# Patient Record
Sex: Female | Born: 1950
Health system: Southern US, Community
[De-identification: ages and names within clinical notes are randomized; demographics above are authoritative.]

---

## 1997-09-08 ENCOUNTER — Encounter: Admission: RE | Admit: 1997-09-08 | Discharge: 1997-12-07 | Payer: Self-pay | Admitting: Pediatrics

## 1999-11-28 ENCOUNTER — Encounter: Payer: Self-pay | Admitting: Family Medicine

## 1999-11-28 ENCOUNTER — Encounter: Admission: RE | Admit: 1999-11-28 | Discharge: 1999-11-28 | Payer: Self-pay | Admitting: Family Medicine

## 2000-11-28 ENCOUNTER — Encounter: Payer: Self-pay | Admitting: Family Medicine

## 2000-11-28 ENCOUNTER — Encounter: Admission: RE | Admit: 2000-11-28 | Discharge: 2000-11-28 | Payer: Self-pay | Admitting: Family Medicine

## 2000-12-05 ENCOUNTER — Other Ambulatory Visit: Admission: RE | Admit: 2000-12-05 | Discharge: 2000-12-05 | Payer: Self-pay | Admitting: Obstetrics and Gynecology

## 2001-12-03 ENCOUNTER — Encounter: Admission: RE | Admit: 2001-12-03 | Discharge: 2001-12-03 | Payer: Self-pay | Admitting: Obstetrics and Gynecology

## 2001-12-03 ENCOUNTER — Encounter: Payer: Self-pay | Admitting: Obstetrics and Gynecology

## 2001-12-24 ENCOUNTER — Ambulatory Visit (HOSPITAL_COMMUNITY): Admission: RE | Admit: 2001-12-24 | Discharge: 2001-12-24 | Payer: Self-pay | Admitting: Rheumatology

## 2001-12-24 ENCOUNTER — Encounter: Payer: Self-pay | Admitting: Rheumatology

## 2002-01-02 ENCOUNTER — Other Ambulatory Visit: Admission: RE | Admit: 2002-01-02 | Discharge: 2002-01-02 | Payer: Self-pay | Admitting: Obstetrics and Gynecology

## 2002-01-07 ENCOUNTER — Ambulatory Visit (HOSPITAL_COMMUNITY): Admission: RE | Admit: 2002-01-07 | Discharge: 2002-01-07 | Payer: Self-pay | Admitting: Interventional Radiology

## 2002-01-07 ENCOUNTER — Encounter: Payer: Self-pay | Admitting: Rheumatology

## 2002-02-14 ENCOUNTER — Encounter: Payer: Self-pay | Admitting: Emergency Medicine

## 2002-02-14 ENCOUNTER — Emergency Department (HOSPITAL_COMMUNITY): Admission: EM | Admit: 2002-02-14 | Discharge: 2002-02-14 | Payer: Self-pay | Admitting: Emergency Medicine

## 2002-03-24 ENCOUNTER — Ambulatory Visit (HOSPITAL_COMMUNITY): Admission: RE | Admit: 2002-03-24 | Discharge: 2002-03-24 | Payer: Self-pay | Admitting: Gastroenterology

## 2002-03-24 ENCOUNTER — Encounter (INDEPENDENT_AMBULATORY_CARE_PROVIDER_SITE_OTHER): Payer: Self-pay

## 2002-12-08 ENCOUNTER — Encounter: Payer: Self-pay | Admitting: Obstetrics and Gynecology

## 2002-12-08 ENCOUNTER — Encounter: Admission: RE | Admit: 2002-12-08 | Discharge: 2002-12-08 | Payer: Self-pay | Admitting: Obstetrics and Gynecology

## 2003-01-08 ENCOUNTER — Other Ambulatory Visit: Admission: RE | Admit: 2003-01-08 | Discharge: 2003-01-08 | Payer: Self-pay | Admitting: Obstetrics and Gynecology

## 2003-12-15 ENCOUNTER — Encounter: Admission: RE | Admit: 2003-12-15 | Discharge: 2003-12-15 | Payer: Self-pay | Admitting: Obstetrics and Gynecology

## 2004-01-20 ENCOUNTER — Other Ambulatory Visit: Admission: RE | Admit: 2004-01-20 | Discharge: 2004-01-20 | Payer: Self-pay | Admitting: Obstetrics and Gynecology

## 2004-12-15 ENCOUNTER — Encounter: Admission: RE | Admit: 2004-12-15 | Discharge: 2004-12-15 | Payer: Self-pay | Admitting: Obstetrics and Gynecology

## 2005-01-24 ENCOUNTER — Other Ambulatory Visit: Admission: RE | Admit: 2005-01-24 | Discharge: 2005-01-24 | Payer: Self-pay | Admitting: Obstetrics and Gynecology

## 2005-12-18 ENCOUNTER — Encounter: Admission: RE | Admit: 2005-12-18 | Discharge: 2005-12-18 | Payer: Self-pay | Admitting: Obstetrics and Gynecology

## 2007-06-20 ENCOUNTER — Other Ambulatory Visit: Admission: RE | Admit: 2007-06-20 | Discharge: 2007-06-20 | Payer: Self-pay | Admitting: Family Medicine

## 2007-07-02 ENCOUNTER — Encounter: Admission: RE | Admit: 2007-07-02 | Discharge: 2007-07-02 | Payer: Self-pay | Admitting: Family Medicine

## 2007-10-04 ENCOUNTER — Encounter: Admission: RE | Admit: 2007-10-04 | Discharge: 2007-10-04 | Payer: Self-pay | Admitting: Family Medicine

## 2008-11-18 ENCOUNTER — Other Ambulatory Visit: Admission: RE | Admit: 2008-11-18 | Discharge: 2008-11-18 | Payer: Self-pay | Admitting: Family Medicine

## 2008-12-01 ENCOUNTER — Encounter: Admission: RE | Admit: 2008-12-01 | Discharge: 2008-12-01 | Payer: Self-pay | Admitting: Family Medicine

## 2009-12-02 ENCOUNTER — Encounter: Admission: RE | Admit: 2009-12-02 | Discharge: 2009-12-02 | Payer: Self-pay | Admitting: Family Medicine

## 2010-06-05 ENCOUNTER — Encounter: Payer: Self-pay | Admitting: Family Medicine

## 2010-09-30 NOTE — Op Note (Signed)
   NAME:  Susan Manning, Susan Manning                                  ACCOUNT NO.:  [id number]   MEDICAL RECORD NO.:  [id number]                   PATIENT TYPE:  AMB   LOCATION:  ENDO                                 FACILITY:  [facility]   PHYSICIAN:  John C. Madilyn Fireman, M.D.                 DATE OF BIRTH:  [birth year]   DATE OF PROCEDURE:  03/24/2002  DATE OF DISCHARGE:                                 OPERATIVE REPORT   PROCEDURE:  Colonoscopy with polypectomy.   ENDOSCOPIST:  Everardo All.  Madilyn Fireman, M.D.   INDICATIONS:  The patient is a 61 year old patient with iron deficiency  anemia.  No prior colon screening.   DESCRIPTION OF PROCEDURE:  The patient was placed in the left lateral  decubitus position and placed on the pulse monitor with continuous low flow  oxygen delivered by nasal cannula.  She was sedated with 60 mg of IV Demerol  and 6 mg of IV Versed.  The Olympus video colonoscope was inserted into the  rectum and advanced to the cecum, confirmed by transillumination of  McBurney's point and visualization of the ileocecal valve and the  appendiceal orifice.  The prep was good.  The cecum, ascending, transverse,  descending and sigmoid colon all appeared normal with no masses, polyps,  diverticula or other mucosa abnormalities.  Within the rectum there was seen  a 1.2 cm polyp which was removed by snare.  This was approximately 18 cm  from the anal verge.  This was removed by snare.  The remainder of the  rectum appeared normal.  The scope was then withdrawn, and the patient  returned to the recovery room in stable condition.  She tolerated the  procedure well and there were no immediate complications.   IMPRESSIONS:  1. Rectal polyp 1.2 cm.  2. Otherwise normal colonoscopy.   PLAN:  Await biopsy results.                                               John C. Madilyn Fireman, M.D.    J[facility]/MEDQ  D:  03/24/2002  T:  03/24/2002  Job:  [id number]   cc:   Vikki Ports, M.D.  [address] [place]  [place]  [place] [address]  Fax: [phone]

## 2010-11-30 ENCOUNTER — Other Ambulatory Visit: Payer: Self-pay | Admitting: Family Medicine

## 2010-11-30 DIAGNOSIS — Z1231 Encounter for screening mammogram for malignant neoplasm of breast: Secondary | ICD-10-CM

## 2010-12-07 ENCOUNTER — Other Ambulatory Visit: Payer: Self-pay | Admitting: Obstetrics and Gynecology

## 2010-12-07 ENCOUNTER — Ambulatory Visit
Admission: RE | Admit: 2010-12-07 | Discharge: 2010-12-07 | Disposition: A | Discharge status: Home / Self Care | Payer: BC Managed Care – PPO | Source: Ambulatory Visit | Attending: Family Medicine | Admitting: Family Medicine

## 2010-12-07 DIAGNOSIS — Z1231 Encounter for screening mammogram for malignant neoplasm of breast: Secondary | ICD-10-CM

## 2011-12-13 ENCOUNTER — Other Ambulatory Visit: Payer: Self-pay | Admitting: Obstetrics and Gynecology

## 2011-12-13 DIAGNOSIS — N6310 Unspecified lump in the right breast, unspecified quadrant: Secondary | ICD-10-CM

## 2011-12-20 ENCOUNTER — Ambulatory Visit
Admission: RE | Admit: 2011-12-20 | Discharge: 2011-12-20 | Disposition: A | Discharge status: Home / Self Care | Payer: BC Managed Care – PPO | Source: Ambulatory Visit | Attending: Obstetrics and Gynecology | Admitting: Obstetrics and Gynecology

## 2011-12-20 DIAGNOSIS — N6310 Unspecified lump in the right breast, unspecified quadrant: Secondary | ICD-10-CM

## 2012-01-22 ENCOUNTER — Other Ambulatory Visit: Payer: Self-pay | Admitting: Gastroenterology

## 2012-11-19 ENCOUNTER — Other Ambulatory Visit: Payer: Self-pay

## 2012-11-19 DIAGNOSIS — Z1231 Encounter for screening mammogram for malignant neoplasm of breast: Secondary | ICD-10-CM

## 2012-12-20 ENCOUNTER — Ambulatory Visit
Admission: RE | Admit: 2012-12-20 | Discharge: 2012-12-20 | Disposition: A | Discharge status: Home / Self Care | Payer: BC Managed Care – PPO | Source: Ambulatory Visit

## 2012-12-20 DIAGNOSIS — Z1231 Encounter for screening mammogram for malignant neoplasm of breast: Secondary | ICD-10-CM

## 2013-01-29 ENCOUNTER — Other Ambulatory Visit: Payer: Self-pay | Admitting: Obstetrics and Gynecology

## 2013-12-09 ENCOUNTER — Other Ambulatory Visit: Payer: Self-pay

## 2013-12-09 DIAGNOSIS — Z1231 Encounter for screening mammogram for malignant neoplasm of breast: Secondary | ICD-10-CM

## 2013-12-23 ENCOUNTER — Encounter (INDEPENDENT_AMBULATORY_CARE_PROVIDER_SITE_OTHER): Payer: Self-pay

## 2013-12-23 ENCOUNTER — Ambulatory Visit
Admission: RE | Admit: 2013-12-23 | Discharge: 2013-12-23 | Disposition: A | Discharge status: Home / Self Care | Payer: BC Managed Care – PPO | Source: Ambulatory Visit

## 2013-12-23 DIAGNOSIS — Z1231 Encounter for screening mammogram for malignant neoplasm of breast: Secondary | ICD-10-CM

## 2014-01-01 ENCOUNTER — Other Ambulatory Visit: Payer: Self-pay | Admitting: Family Medicine

## 2014-01-01 DIAGNOSIS — E2839 Other primary ovarian failure: Secondary | ICD-10-CM

## 2014-02-25 ENCOUNTER — Ambulatory Visit
Admission: RE | Admit: 2014-02-25 | Discharge: 2014-02-25 | Disposition: A | Discharge status: Home / Self Care | Payer: BC Managed Care – PPO | Source: Ambulatory Visit | Attending: Family Medicine | Admitting: Family Medicine

## 2014-02-25 DIAGNOSIS — E2839 Other primary ovarian failure: Secondary | ICD-10-CM

## 2014-12-01 ENCOUNTER — Other Ambulatory Visit: Payer: Self-pay

## 2014-12-01 DIAGNOSIS — Z1231 Encounter for screening mammogram for malignant neoplasm of breast: Secondary | ICD-10-CM

## 2014-12-29 ENCOUNTER — Ambulatory Visit
Admission: RE | Admit: 2014-12-29 | Discharge: 2014-12-29 | Disposition: A | Discharge status: Home / Self Care | Payer: BLUE CROSS/BLUE SHIELD | Source: Ambulatory Visit

## 2014-12-29 DIAGNOSIS — Z1231 Encounter for screening mammogram for malignant neoplasm of breast: Secondary | ICD-10-CM

## 2015-02-24 ENCOUNTER — Other Ambulatory Visit: Payer: Self-pay | Admitting: Obstetrics and Gynecology

## 2015-02-25 LAB — CYTOLOGY - PAP

## 2015-10-26 DIAGNOSIS — R69 Illness, unspecified: Secondary | ICD-10-CM | POA: Diagnosis not present

## 2015-12-31 ENCOUNTER — Other Ambulatory Visit: Payer: Self-pay | Admitting: Family Medicine

## 2015-12-31 DIAGNOSIS — Z1231 Encounter for screening mammogram for malignant neoplasm of breast: Secondary | ICD-10-CM

## 2016-01-04 ENCOUNTER — Ambulatory Visit
Admission: RE | Admit: 2016-01-04 | Discharge: 2016-01-04 | Disposition: A | Discharge status: Home / Self Care | Payer: Medicare HMO | Source: Ambulatory Visit | Attending: Family Medicine | Admitting: Family Medicine

## 2016-01-04 DIAGNOSIS — Z1231 Encounter for screening mammogram for malignant neoplasm of breast: Secondary | ICD-10-CM | POA: Diagnosis not present

## 2016-01-28 DIAGNOSIS — R7301 Impaired fasting glucose: Secondary | ICD-10-CM | POA: Diagnosis not present

## 2016-01-28 DIAGNOSIS — Z23 Encounter for immunization: Secondary | ICD-10-CM | POA: Diagnosis not present

## 2016-01-28 DIAGNOSIS — Z86010 Personal history of colonic polyps: Secondary | ICD-10-CM | POA: Diagnosis not present

## 2016-01-28 DIAGNOSIS — I10 Essential (primary) hypertension: Secondary | ICD-10-CM | POA: Diagnosis not present

## 2016-01-28 DIAGNOSIS — Z0000 Encounter for general adult medical examination without abnormal findings: Secondary | ICD-10-CM | POA: Diagnosis not present

## 2016-01-28 DIAGNOSIS — Z209 Contact with and (suspected) exposure to unspecified communicable disease: Secondary | ICD-10-CM | POA: Diagnosis not present

## 2016-01-28 DIAGNOSIS — J309 Allergic rhinitis, unspecified: Secondary | ICD-10-CM | POA: Diagnosis not present

## 2016-02-18 DIAGNOSIS — H524 Presbyopia: Secondary | ICD-10-CM | POA: Diagnosis not present

## 2016-02-25 DIAGNOSIS — H4420 Degenerative myopia, unspecified eye: Secondary | ICD-10-CM | POA: Diagnosis not present

## 2016-02-25 DIAGNOSIS — R69 Illness, unspecified: Secondary | ICD-10-CM | POA: Diagnosis not present

## 2016-03-15 ENCOUNTER — Other Ambulatory Visit: Payer: Self-pay | Admitting: Obstetrics and Gynecology

## 2016-03-15 DIAGNOSIS — N952 Postmenopausal atrophic vaginitis: Secondary | ICD-10-CM | POA: Diagnosis not present

## 2016-03-15 DIAGNOSIS — N951 Menopausal and female climacteric states: Secondary | ICD-10-CM | POA: Diagnosis not present

## 2016-03-15 DIAGNOSIS — Z124 Encounter for screening for malignant neoplasm of cervix: Secondary | ICD-10-CM | POA: Diagnosis not present

## 2016-03-16 LAB — CYTOLOGY - PAP

## 2016-04-18 DIAGNOSIS — Z6824 Body mass index (BMI) 24.0-24.9, adult: Secondary | ICD-10-CM | POA: Diagnosis not present

## 2016-04-18 DIAGNOSIS — Z0000 Encounter for general adult medical examination without abnormal findings: Secondary | ICD-10-CM | POA: Diagnosis not present

## 2016-04-18 DIAGNOSIS — I10 Essential (primary) hypertension: Secondary | ICD-10-CM | POA: Diagnosis not present

## 2017-02-05 ENCOUNTER — Other Ambulatory Visit: Payer: Self-pay | Admitting: Family Medicine

## 2017-02-05 DIAGNOSIS — Z1231 Encounter for screening mammogram for malignant neoplasm of breast: Secondary | ICD-10-CM

## 2017-02-09 DIAGNOSIS — Z86010 Personal history of colonic polyps: Secondary | ICD-10-CM | POA: Diagnosis not present

## 2017-02-09 DIAGNOSIS — Z23 Encounter for immunization: Secondary | ICD-10-CM | POA: Diagnosis not present

## 2017-02-09 DIAGNOSIS — Z0000 Encounter for general adult medical examination without abnormal findings: Secondary | ICD-10-CM | POA: Diagnosis not present

## 2017-02-09 DIAGNOSIS — R8290 Unspecified abnormal findings in urine: Secondary | ICD-10-CM | POA: Diagnosis not present

## 2017-02-09 DIAGNOSIS — R7301 Impaired fasting glucose: Secondary | ICD-10-CM | POA: Diagnosis not present

## 2017-02-09 DIAGNOSIS — I10 Essential (primary) hypertension: Secondary | ICD-10-CM | POA: Diagnosis not present

## 2017-02-15 ENCOUNTER — Ambulatory Visit
Admission: RE | Admit: 2017-02-15 | Discharge: 2017-02-15 | Disposition: A | Discharge status: Home / Self Care | Payer: Medicare HMO | Source: Ambulatory Visit | Attending: Family Medicine | Admitting: Family Medicine

## 2017-02-15 DIAGNOSIS — Z1231 Encounter for screening mammogram for malignant neoplasm of breast: Secondary | ICD-10-CM | POA: Diagnosis not present

## 2017-02-20 DIAGNOSIS — H524 Presbyopia: Secondary | ICD-10-CM | POA: Diagnosis not present

## 2017-03-05 DIAGNOSIS — R69 Illness, unspecified: Secondary | ICD-10-CM | POA: Diagnosis not present

## 2017-03-05 DIAGNOSIS — Z0100 Encounter for examination of eyes and vision without abnormal findings: Secondary | ICD-10-CM | POA: Diagnosis not present

## 2017-03-08 DIAGNOSIS — L821 Other seborrheic keratosis: Secondary | ICD-10-CM | POA: Diagnosis not present

## 2017-07-19 DIAGNOSIS — J069 Acute upper respiratory infection, unspecified: Secondary | ICD-10-CM | POA: Diagnosis not present

## 2017-08-08 DIAGNOSIS — L509 Urticaria, unspecified: Secondary | ICD-10-CM | POA: Diagnosis not present

## 2017-09-18 DIAGNOSIS — R69 Illness, unspecified: Secondary | ICD-10-CM | POA: Diagnosis not present

## 2017-10-16 DIAGNOSIS — J040 Acute laryngitis: Secondary | ICD-10-CM | POA: Diagnosis not present

## 2017-10-25 DIAGNOSIS — J309 Allergic rhinitis, unspecified: Secondary | ICD-10-CM | POA: Diagnosis not present

## 2018-01-04 ENCOUNTER — Other Ambulatory Visit: Payer: Self-pay | Admitting: Family Medicine

## 2018-01-04 DIAGNOSIS — Z1231 Encounter for screening mammogram for malignant neoplasm of breast: Secondary | ICD-10-CM

## 2018-02-12 DIAGNOSIS — R69 Illness, unspecified: Secondary | ICD-10-CM | POA: Diagnosis not present

## 2018-02-13 DIAGNOSIS — Z124 Encounter for screening for malignant neoplasm of cervix: Secondary | ICD-10-CM | POA: Diagnosis not present

## 2018-02-13 DIAGNOSIS — Z6825 Body mass index (BMI) 25.0-25.9, adult: Secondary | ICD-10-CM | POA: Diagnosis not present

## 2018-02-14 DIAGNOSIS — H524 Presbyopia: Secondary | ICD-10-CM | POA: Diagnosis not present

## 2018-02-15 DIAGNOSIS — Z0000 Encounter for general adult medical examination without abnormal findings: Secondary | ICD-10-CM | POA: Diagnosis not present

## 2018-02-15 DIAGNOSIS — R8290 Unspecified abnormal findings in urine: Secondary | ICD-10-CM | POA: Diagnosis not present

## 2018-02-15 DIAGNOSIS — E7800 Pure hypercholesterolemia, unspecified: Secondary | ICD-10-CM | POA: Diagnosis not present

## 2018-02-15 DIAGNOSIS — L989 Disorder of the skin and subcutaneous tissue, unspecified: Secondary | ICD-10-CM | POA: Diagnosis not present

## 2018-02-15 DIAGNOSIS — Z86010 Personal history of colonic polyps: Secondary | ICD-10-CM | POA: Diagnosis not present

## 2018-02-15 DIAGNOSIS — R7301 Impaired fasting glucose: Secondary | ICD-10-CM | POA: Diagnosis not present

## 2018-02-15 DIAGNOSIS — J309 Allergic rhinitis, unspecified: Secondary | ICD-10-CM | POA: Diagnosis not present

## 2018-02-15 DIAGNOSIS — Z23 Encounter for immunization: Secondary | ICD-10-CM | POA: Diagnosis not present

## 2018-02-15 DIAGNOSIS — I10 Essential (primary) hypertension: Secondary | ICD-10-CM | POA: Diagnosis not present

## 2018-02-18 ENCOUNTER — Ambulatory Visit: Payer: Medicare HMO

## 2018-02-19 DIAGNOSIS — D123 Benign neoplasm of transverse colon: Secondary | ICD-10-CM | POA: Diagnosis not present

## 2018-02-19 DIAGNOSIS — Z86010 Personal history of colonic polyps: Secondary | ICD-10-CM | POA: Diagnosis not present

## 2018-02-21 DIAGNOSIS — Z1231 Encounter for screening mammogram for malignant neoplasm of breast: Secondary | ICD-10-CM | POA: Diagnosis not present

## 2018-02-21 DIAGNOSIS — R87615 Unsatisfactory cytologic smear of cervix: Secondary | ICD-10-CM | POA: Diagnosis not present

## 2018-02-22 DIAGNOSIS — D123 Benign neoplasm of transverse colon: Secondary | ICD-10-CM | POA: Diagnosis not present

## 2018-03-11 ENCOUNTER — Ambulatory Visit: Payer: Medicare HMO

## 2018-03-22 DIAGNOSIS — Z0100 Encounter for examination of eyes and vision without abnormal findings: Secondary | ICD-10-CM | POA: Diagnosis not present

## 2018-07-11 DIAGNOSIS — T17208A Unspecified foreign body in pharynx causing other injury, initial encounter: Secondary | ICD-10-CM | POA: Diagnosis not present

## 2018-07-15 DIAGNOSIS — J343 Hypertrophy of nasal turbinates: Secondary | ICD-10-CM | POA: Diagnosis not present

## 2018-07-15 DIAGNOSIS — T17208A Unspecified foreign body in pharynx causing other injury, initial encounter: Secondary | ICD-10-CM | POA: Diagnosis not present

## 2018-07-15 DIAGNOSIS — T17228A Food in pharynx causing other injury, initial encounter: Secondary | ICD-10-CM | POA: Diagnosis not present

## 2018-11-19 DIAGNOSIS — Z0100 Encounter for examination of eyes and vision without abnormal findings: Secondary | ICD-10-CM | POA: Diagnosis not present

## 2018-12-31 DIAGNOSIS — B079 Viral wart, unspecified: Secondary | ICD-10-CM | POA: Diagnosis not present

## 2018-12-31 DIAGNOSIS — D485 Neoplasm of uncertain behavior of skin: Secondary | ICD-10-CM | POA: Diagnosis not present

## 2019-02-04 DIAGNOSIS — R69 Illness, unspecified: Secondary | ICD-10-CM | POA: Diagnosis not present

## 2019-02-20 DIAGNOSIS — Z86010 Personal history of colonic polyps: Secondary | ICD-10-CM | POA: Diagnosis not present

## 2019-02-20 DIAGNOSIS — R21 Rash and other nonspecific skin eruption: Secondary | ICD-10-CM | POA: Diagnosis not present

## 2019-02-20 DIAGNOSIS — E7800 Pure hypercholesterolemia, unspecified: Secondary | ICD-10-CM | POA: Diagnosis not present

## 2019-02-20 DIAGNOSIS — R7301 Impaired fasting glucose: Secondary | ICD-10-CM | POA: Diagnosis not present

## 2019-02-20 DIAGNOSIS — Z0000 Encounter for general adult medical examination without abnormal findings: Secondary | ICD-10-CM | POA: Diagnosis not present

## 2019-02-20 DIAGNOSIS — Z1159 Encounter for screening for other viral diseases: Secondary | ICD-10-CM | POA: Diagnosis not present

## 2019-02-20 DIAGNOSIS — R8290 Unspecified abnormal findings in urine: Secondary | ICD-10-CM | POA: Diagnosis not present

## 2019-02-20 DIAGNOSIS — I10 Essential (primary) hypertension: Secondary | ICD-10-CM | POA: Diagnosis not present

## 2019-02-24 DIAGNOSIS — H524 Presbyopia: Secondary | ICD-10-CM | POA: Diagnosis not present

## 2019-04-01 DIAGNOSIS — Z1231 Encounter for screening mammogram for malignant neoplasm of breast: Secondary | ICD-10-CM | POA: Diagnosis not present

## 2019-04-01 DIAGNOSIS — Z01419 Encounter for gynecological examination (general) (routine) without abnormal findings: Secondary | ICD-10-CM | POA: Diagnosis not present

## 2019-04-01 DIAGNOSIS — Z6825 Body mass index (BMI) 25.0-25.9, adult: Secondary | ICD-10-CM | POA: Diagnosis not present

## 2019-04-22 DIAGNOSIS — Z0100 Encounter for examination of eyes and vision without abnormal findings: Secondary | ICD-10-CM | POA: Diagnosis not present

## 2019-05-14 DIAGNOSIS — E7800 Pure hypercholesterolemia, unspecified: Secondary | ICD-10-CM | POA: Diagnosis not present

## 2019-05-14 DIAGNOSIS — R7301 Impaired fasting glucose: Secondary | ICD-10-CM | POA: Diagnosis not present

## 2019-06-07 ENCOUNTER — Ambulatory Visit: Payer: Medicare HMO | Attending: Internal Medicine

## 2019-06-07 DIAGNOSIS — Z23 Encounter for immunization: Secondary | ICD-10-CM

## 2019-06-07 NOTE — Progress Notes (Signed)
   Covid-19 Vaccination Clinic  Name:  Sanisha Clanton    MRN: [id number] DOB: [birth year]  06/07/2019  Ms. Vallin was observed post Covid-19 immunization for 15 minutes without incidence. She was provided with Vaccine Information Sheet and instruction to access the V-Safe system.   Ms. Blumenschein was instructed to call 911 with any severe reactions post vaccine: Marland Kitchen Difficulty breathing  . Swelling of your face and throat  . A fast heartbeat  . A bad rash all over your body  . Dizziness and weakness    Immunizations Administered    Name Date Dose VIS Date Route   Pfizer COVID-19 Vaccine 06/07/2019  2:55 PM 0.3 mL 04/25/2019 Intramuscular   Manufacturer: [place]   Lot: [id number]   [place]: [id number]

## 2019-06-22 IMAGING — MG 2D DIGITAL SCREENING BILATERAL MAMMOGRAM WITH CAD AND ADJUNCT TO
9 of 13 series · 9 of 29 positions shown · non-contrast
Comparison: Previous exam(s).

CLINICAL DATA: Screening.

EXAM:
2D DIGITAL SCREENING BILATERAL MAMMOGRAM WITH CAD AND ADJUNCT TOMO

[L CC (1 of 2)]
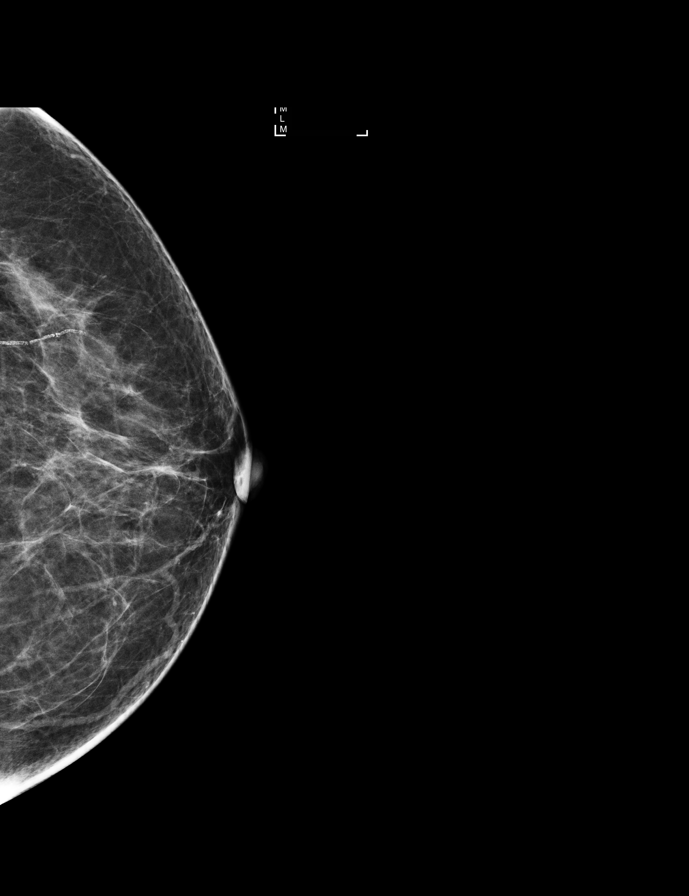

[R CC]
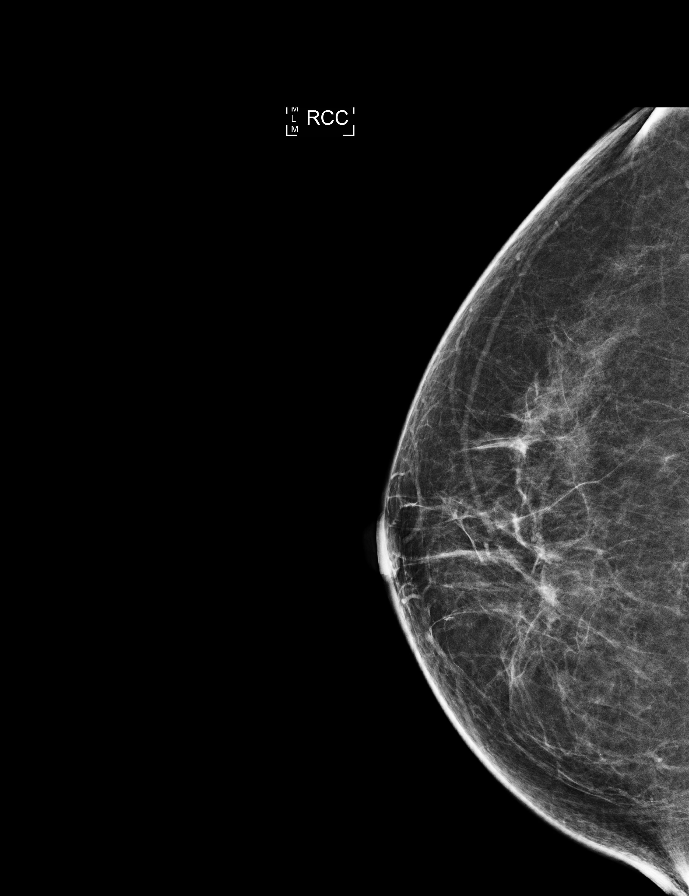

[R MLO synth-2D]
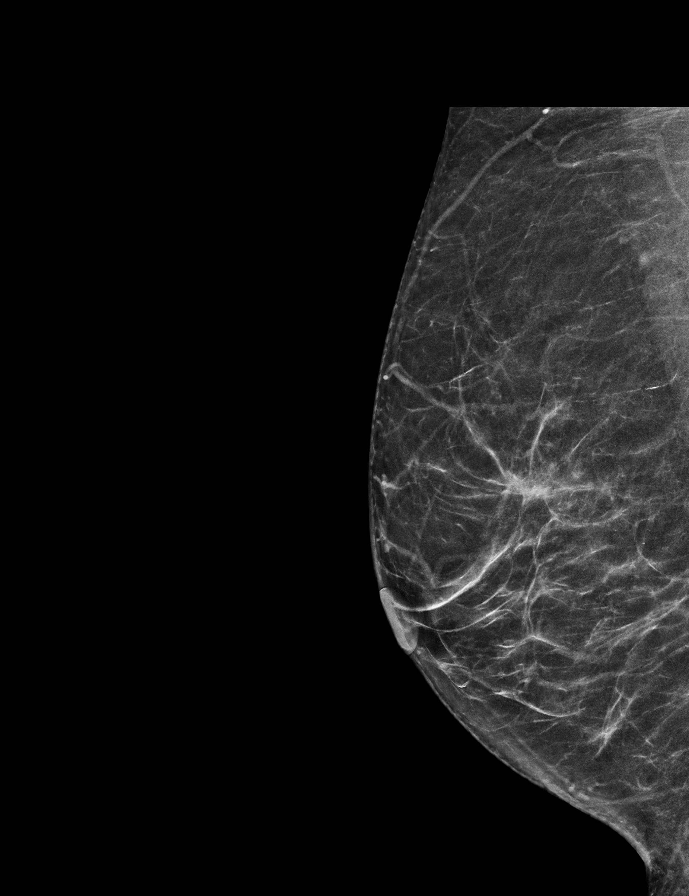

[L MLO synth-2D]
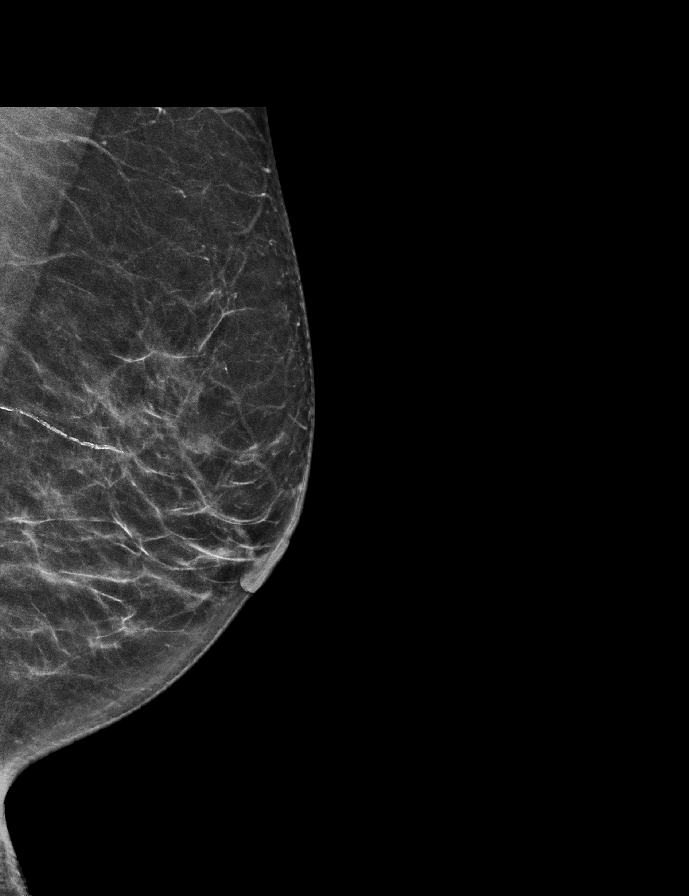

[L CC (2 of 2)]
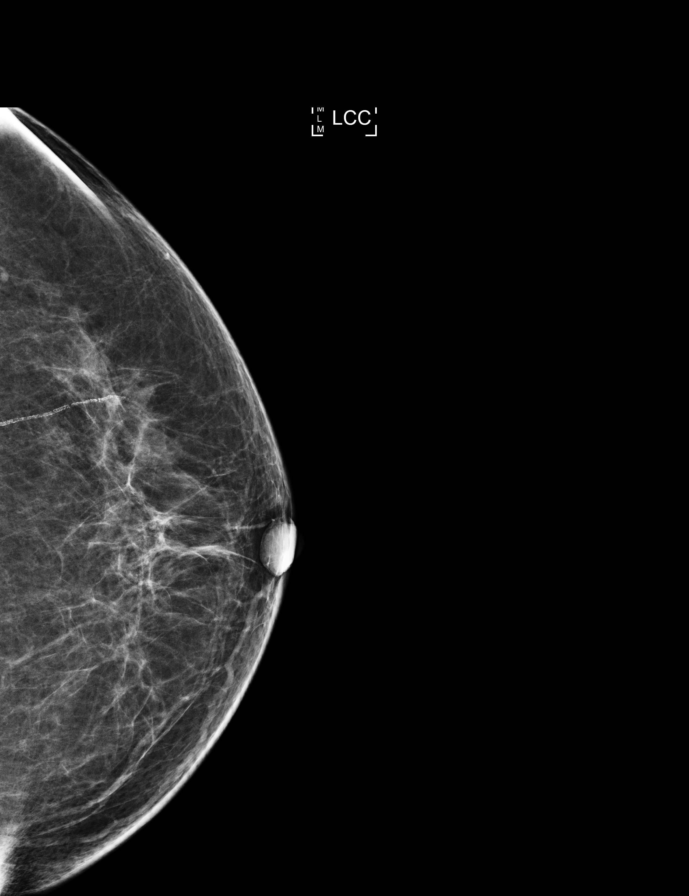

[R MLO]
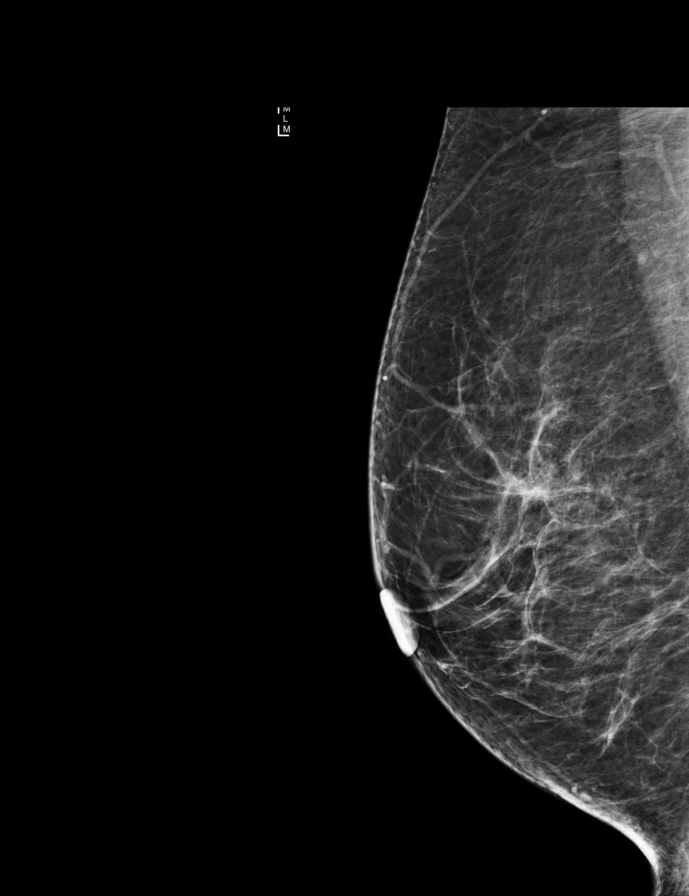

[R CC synth-2D]
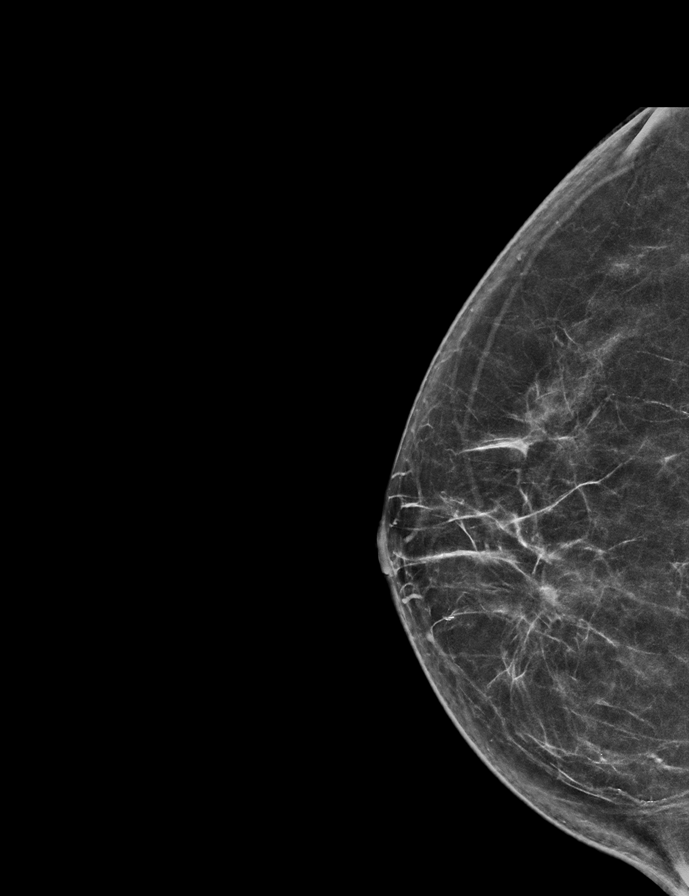

[L CC synth-2D]
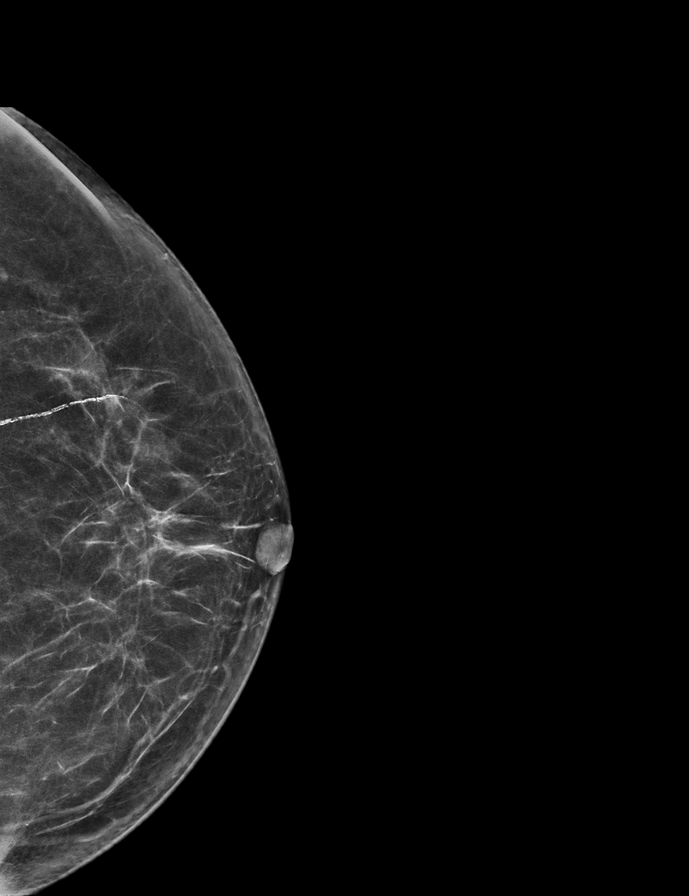

[L MLO]
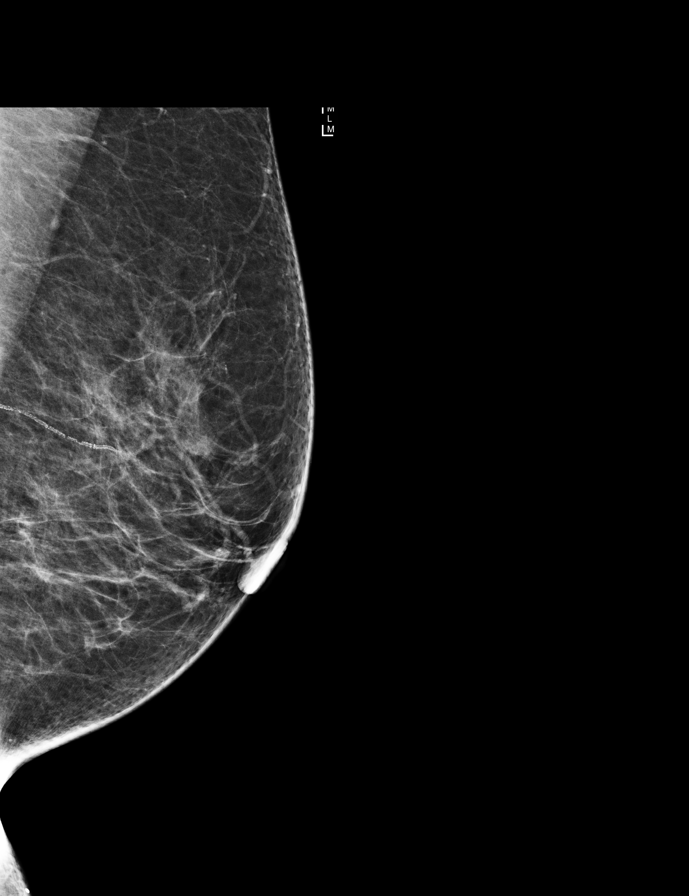

[9 of 29 positions shown; findings below may reference images not displayed]

ACR Breast Density Category b: There are scattered areas of
fibroglandular density.
FINDINGS: There are no findings suspicious for malignancy. Images were
processed with CAD.
IMPRESSION: No mammographic evidence of malignancy. A result letter of this
screening mammogram will be mailed directly to the patient.

RECOMMENDATION:
Screening mammogram in one year. (Code:97-6-RS4)

BI-RADS CATEGORY  1: Negative.

## 2019-06-29 ENCOUNTER — Ambulatory Visit: Payer: Medicare HMO | Attending: Internal Medicine

## 2019-06-29 DIAGNOSIS — Z23 Encounter for immunization: Secondary | ICD-10-CM | POA: Insufficient documentation

## 2019-06-29 NOTE — Progress Notes (Signed)
   Covid-19 Vaccination Clinic  Name:  Susan Manning    MRN: [id number] DOB: 02-24-1951  06/29/2019  [name] was observed post Covid-19 immunization for 15 minutes without incidence. She was provided with Vaccine Information Sheet and instruction to access the V-Safe system.   [name] was instructed to call 911 with any severe reactions post vaccine: Marland Kitchen Difficulty breathing  . Swelling of your face and throat  . A fast heartbeat  . A bad rash all over your body  . Dizziness and weakness    Immunizations Administered    Name Date Dose VIS Date Route   Pfizer COVID-19 Vaccine 06/29/2019 10:35 AM 0.3 mL 04/25/2019 Intramuscular   Manufacturer: [place]   Lot: [id number]   [place]: [id number]

## 2019-07-09 DIAGNOSIS — M5416 Radiculopathy, lumbar region: Secondary | ICD-10-CM | POA: Diagnosis not present

## 2019-07-25 DIAGNOSIS — M5416 Radiculopathy, lumbar region: Secondary | ICD-10-CM | POA: Diagnosis not present

## 2019-09-08 DIAGNOSIS — Z79899 Other long term (current) drug therapy: Secondary | ICD-10-CM | POA: Diagnosis not present

## 2019-09-08 DIAGNOSIS — E7800 Pure hypercholesterolemia, unspecified: Secondary | ICD-10-CM | POA: Diagnosis not present

## 2019-09-17 DIAGNOSIS — R69 Illness, unspecified: Secondary | ICD-10-CM | POA: Diagnosis not present

## 2019-10-28 DIAGNOSIS — Z0100 Encounter for examination of eyes and vision without abnormal findings: Secondary | ICD-10-CM | POA: Diagnosis not present

## 2019-11-24 DIAGNOSIS — M79643 Pain in unspecified hand: Secondary | ICD-10-CM | POA: Diagnosis not present

## 2019-11-24 DIAGNOSIS — R7301 Impaired fasting glucose: Secondary | ICD-10-CM | POA: Diagnosis not present

## 2019-11-24 DIAGNOSIS — M25559 Pain in unspecified hip: Secondary | ICD-10-CM | POA: Diagnosis not present

## 2019-11-24 DIAGNOSIS — M79673 Pain in unspecified foot: Secondary | ICD-10-CM | POA: Diagnosis not present

## 2019-11-28 DIAGNOSIS — L910 Hypertrophic scar: Secondary | ICD-10-CM | POA: Diagnosis not present

## 2020-01-28 DIAGNOSIS — R69 Illness, unspecified: Secondary | ICD-10-CM | POA: Diagnosis not present

## 2020-02-03 DIAGNOSIS — Z0100 Encounter for examination of eyes and vision without abnormal findings: Secondary | ICD-10-CM | POA: Diagnosis not present

## 2020-02-06 DIAGNOSIS — L218 Other seborrheic dermatitis: Secondary | ICD-10-CM | POA: Diagnosis not present

## 2020-02-06 DIAGNOSIS — L910 Hypertrophic scar: Secondary | ICD-10-CM | POA: Diagnosis not present

## 2020-02-09 DIAGNOSIS — R69 Illness, unspecified: Secondary | ICD-10-CM | POA: Diagnosis not present

## 2020-02-17 ENCOUNTER — Ambulatory Visit: Payer: Medicare HMO | Attending: Internal Medicine

## 2020-02-17 DIAGNOSIS — Z23 Encounter for immunization: Secondary | ICD-10-CM

## 2020-02-17 NOTE — Progress Notes (Signed)
   Covid-19 Vaccination Clinic  Name:  Solita Macadam    MRN: [id number] DOB: [birth year]  02/17/2020  Ms. Klinker was observed post Covid-19 immunization for 15 minutes without incident. She was provided with Vaccine Information Sheet and instruction to access the V-Safe system.   Ms. Cottingham was instructed to call 911 with any severe reactions post vaccine: Marland Kitchen Difficulty breathing  . Swelling of face and throat  . A fast heartbeat  . A bad rash all over body  . Dizziness and weakness

## 2020-02-25 DIAGNOSIS — Z13820 Encounter for screening for osteoporosis: Secondary | ICD-10-CM | POA: Diagnosis not present

## 2020-02-25 DIAGNOSIS — I10 Essential (primary) hypertension: Secondary | ICD-10-CM | POA: Diagnosis not present

## 2020-02-25 DIAGNOSIS — Z0000 Encounter for general adult medical examination without abnormal findings: Secondary | ICD-10-CM | POA: Diagnosis not present

## 2020-02-25 DIAGNOSIS — L989 Disorder of the skin and subcutaneous tissue, unspecified: Secondary | ICD-10-CM | POA: Diagnosis not present

## 2020-02-25 DIAGNOSIS — R8290 Unspecified abnormal findings in urine: Secondary | ICD-10-CM | POA: Diagnosis not present

## 2020-02-25 DIAGNOSIS — Z86010 Personal history of colonic polyps: Secondary | ICD-10-CM | POA: Diagnosis not present

## 2020-02-25 DIAGNOSIS — E7800 Pure hypercholesterolemia, unspecified: Secondary | ICD-10-CM | POA: Diagnosis not present

## 2020-02-25 DIAGNOSIS — R7303 Prediabetes: Secondary | ICD-10-CM | POA: Diagnosis not present

## 2020-03-02 DIAGNOSIS — H04129 Dry eye syndrome of unspecified lacrimal gland: Secondary | ICD-10-CM | POA: Diagnosis not present

## 2020-03-02 DIAGNOSIS — H02403 Unspecified ptosis of bilateral eyelids: Secondary | ICD-10-CM | POA: Diagnosis not present

## 2020-03-02 DIAGNOSIS — H25813 Combined forms of age-related cataract, bilateral: Secondary | ICD-10-CM | POA: Diagnosis not present

## 2020-03-02 DIAGNOSIS — H527 Unspecified disorder of refraction: Secondary | ICD-10-CM | POA: Diagnosis not present

## 2020-03-03 ENCOUNTER — Other Ambulatory Visit: Payer: Self-pay | Admitting: Family Medicine

## 2020-03-03 DIAGNOSIS — E2839 Other primary ovarian failure: Secondary | ICD-10-CM

## 2020-03-04 ENCOUNTER — Other Ambulatory Visit: Payer: Self-pay

## 2020-03-04 ENCOUNTER — Ambulatory Visit
Admission: RE | Admit: 2020-03-04 | Discharge: 2020-03-04 | Disposition: A | Discharge status: Home / Self Care | Payer: Medicare HMO | Source: Ambulatory Visit | Attending: Family Medicine | Admitting: Family Medicine

## 2020-03-04 DIAGNOSIS — Z780 Asymptomatic menopausal state: Secondary | ICD-10-CM | POA: Diagnosis not present

## 2020-03-04 DIAGNOSIS — M85852 Other specified disorders of bone density and structure, left thigh: Secondary | ICD-10-CM | POA: Diagnosis not present

## 2020-03-04 DIAGNOSIS — E2839 Other primary ovarian failure: Secondary | ICD-10-CM

## 2020-04-01 DIAGNOSIS — Z6824 Body mass index (BMI) 24.0-24.9, adult: Secondary | ICD-10-CM | POA: Diagnosis not present

## 2020-04-01 DIAGNOSIS — N952 Postmenopausal atrophic vaginitis: Secondary | ICD-10-CM | POA: Diagnosis not present

## 2020-04-01 DIAGNOSIS — Z1231 Encounter for screening mammogram for malignant neoplasm of breast: Secondary | ICD-10-CM | POA: Diagnosis not present

## 2020-04-01 DIAGNOSIS — Z01419 Encounter for gynecological examination (general) (routine) without abnormal findings: Secondary | ICD-10-CM | POA: Diagnosis not present

## 2020-04-06 DIAGNOSIS — H02831 Dermatochalasis of right upper eyelid: Secondary | ICD-10-CM | POA: Diagnosis not present

## 2020-04-06 DIAGNOSIS — H02834 Dermatochalasis of left upper eyelid: Secondary | ICD-10-CM | POA: Diagnosis not present

## 2020-04-16 DIAGNOSIS — L299 Pruritus, unspecified: Secondary | ICD-10-CM | POA: Diagnosis not present

## 2020-04-16 DIAGNOSIS — E7800 Pure hypercholesterolemia, unspecified: Secondary | ICD-10-CM | POA: Diagnosis not present

## 2020-05-19 DIAGNOSIS — R7301 Impaired fasting glucose: Secondary | ICD-10-CM | POA: Diagnosis not present

## 2020-05-19 DIAGNOSIS — D2371 Other benign neoplasm of skin of right lower limb, including hip: Secondary | ICD-10-CM | POA: Diagnosis not present

## 2020-05-19 DIAGNOSIS — E7800 Pure hypercholesterolemia, unspecified: Secondary | ICD-10-CM | POA: Diagnosis not present

## 2020-05-19 DIAGNOSIS — L308 Other specified dermatitis: Secondary | ICD-10-CM | POA: Diagnosis not present

## 2020-05-19 DIAGNOSIS — L218 Other seborrheic dermatitis: Secondary | ICD-10-CM | POA: Diagnosis not present

## 2020-05-19 DIAGNOSIS — Z79899 Other long term (current) drug therapy: Secondary | ICD-10-CM | POA: Diagnosis not present

## 2020-08-05 DIAGNOSIS — D2371 Other benign neoplasm of skin of right lower limb, including hip: Secondary | ICD-10-CM | POA: Diagnosis not present

## 2020-08-05 DIAGNOSIS — L910 Hypertrophic scar: Secondary | ICD-10-CM | POA: Diagnosis not present

## 2020-08-13 DIAGNOSIS — I10 Essential (primary) hypertension: Secondary | ICD-10-CM | POA: Diagnosis not present

## 2020-08-13 DIAGNOSIS — E785 Hyperlipidemia, unspecified: Secondary | ICD-10-CM | POA: Diagnosis not present

## 2020-08-13 DIAGNOSIS — G8929 Other chronic pain: Secondary | ICD-10-CM | POA: Diagnosis not present

## 2020-08-13 DIAGNOSIS — J309 Allergic rhinitis, unspecified: Secondary | ICD-10-CM | POA: Diagnosis not present

## 2020-08-13 DIAGNOSIS — M1990 Unspecified osteoarthritis, unspecified site: Secondary | ICD-10-CM | POA: Diagnosis not present

## 2020-08-13 DIAGNOSIS — Z8249 Family history of ischemic heart disease and other diseases of the circulatory system: Secondary | ICD-10-CM | POA: Diagnosis not present

## 2020-08-17 DIAGNOSIS — H02834 Dermatochalasis of left upper eyelid: Secondary | ICD-10-CM | POA: Diagnosis not present

## 2020-08-17 DIAGNOSIS — Z01818 Encounter for other preprocedural examination: Secondary | ICD-10-CM | POA: Diagnosis not present

## 2020-08-17 DIAGNOSIS — H02831 Dermatochalasis of right upper eyelid: Secondary | ICD-10-CM | POA: Diagnosis not present

## 2020-08-20 DIAGNOSIS — E7800 Pure hypercholesterolemia, unspecified: Secondary | ICD-10-CM | POA: Diagnosis not present

## 2020-09-21 DIAGNOSIS — H1010 Acute atopic conjunctivitis, unspecified eye: Secondary | ICD-10-CM | POA: Diagnosis not present

## 2020-10-13 ENCOUNTER — Ambulatory Visit (INDEPENDENT_AMBULATORY_CARE_PROVIDER_SITE_OTHER): Payer: MEDICARE | Admitting: Podiatry

## 2020-10-13 ENCOUNTER — Other Ambulatory Visit: Payer: Self-pay

## 2020-10-13 ENCOUNTER — Ambulatory Visit (INDEPENDENT_AMBULATORY_CARE_PROVIDER_SITE_OTHER): Payer: MEDICARE

## 2020-10-13 DIAGNOSIS — R200 Anesthesia of skin: Secondary | ICD-10-CM

## 2020-10-13 DIAGNOSIS — R202 Paresthesia of skin: Secondary | ICD-10-CM

## 2020-10-13 DIAGNOSIS — M792 Neuralgia and neuritis, unspecified: Secondary | ICD-10-CM | POA: Diagnosis not present

## 2020-10-13 DIAGNOSIS — M79671 Pain in right foot: Secondary | ICD-10-CM

## 2020-10-19 ENCOUNTER — Other Ambulatory Visit: Payer: Self-pay | Admitting: *Deleted

## 2020-10-19 ENCOUNTER — Encounter: Payer: Self-pay | Admitting: Podiatry

## 2020-10-19 ENCOUNTER — Telehealth: Payer: Self-pay | Admitting: Podiatry

## 2020-10-19 ENCOUNTER — Telehealth: Payer: Self-pay | Admitting: *Deleted

## 2020-10-19 NOTE — Telephone Encounter (Signed)
Called,no answer, left vmessage that the nerve and EMG study will be done at [facility].

## 2020-10-19 NOTE — Progress Notes (Signed)
Subjective:  Patient ID: Susan Manning, female    DOB: 15-Apr-1951,  MRN: [id number]  Chief Complaint  Patient presents with  . Foot Pain    Bilateral burning sensation     70 y.o. female presents with the above complaint.  Patient presents with complaint bilateral numbness tingling and burning to both plantar foot.  She states that she is not a diabetic does not have any back pain or any history of sciatica.  She states is constantly hurts to ambulate.  She does not have any achy dull pain.  It seems to be the entire plantar aspect of the foot.  She denies any other acute complaints.  She has not seen anyone else prior to seeing me.  She has not seen a neurologist.  She is not on gabapentin.   Review of Systems: Negative except as noted in the HPI. Denies N/V/F/Ch.  No past medical history on file.  Current Outpatient Medications:  .  amLODipine (NORVASC) 5 MG tablet, Take 1 tablet by mouth daily., Disp: , Rfl:  .  amLODipine (NORVASC) 5 MG tablet, Take 1 tablet by mouth daily., Disp: , Rfl:  .  erythromycin ophthalmic ointment, SMARTSIG:1 CENTIMETER(S) In Eye(s) Twice Daily, Disp: , Rfl:  .  fluticasone (FLONASE) 50 MCG/ACT nasal spray, fluticasone propionate 50 mcg/actuation nasal spray,suspension, Disp: , Rfl:  .  naproxen sodium (ALEVE) 220 MG tablet, 1 tablet with food or milk as needed, Disp: , Rfl:  .  Omega-3 Fatty Acids (FISH OIL) 1000 MG CAPS, Fish Oil, Disp: , Rfl:  .  Omega-3 Fatty Acids (FISH OIL) 1200 MG CAPS, 1 capsule with a meal, Disp: , Rfl:  .  rosuvastatin (CRESTOR) 5 MG tablet, Take 5 mg by mouth daily., Disp: , Rfl:   Social History   Tobacco Use  Smoking Status Not on file  Smokeless Tobacco Not on file    Allergies  Allergen Reactions  . Codeine     Other reaction(s): Other (See Comments), pass out  . Olmesartan     Other reaction(s): intolerant  . Penicillins     Other reaction(s): itching, Other (See Comments)  . Ramipril     Other reaction(s): cough    Objective:  There were no vitals filed for this visit. There is no height or weight on file to calculate BMI. Constitutional Well developed. Well nourished.  Vascular Dorsalis pedis pulses palpable bilaterally. Posterior tibial pulses palpable bilaterally. Capillary refill normal to all digits.  No cyanosis or clubbing noted. Pedal hair growth normal.  Neurologic Normal speech. Oriented to person, place, and time. Protective sensation is intact bilaterally.  Subjective complaint numbness tingling noted to both lower extremity.  Negative Tinel's sign for tarsal tunnel syndrome.  Negative common peroneal nerve nerve compression.  Dermatologic Nails within normal limits Skin within normal limits  Orthopedic: Normal joint ROM without pain or crepitus bilaterally. No visible deformities. No bony tenderness.   Radiographs: None Assessment:   1. Numbness and tingling of foot   2. Neuritis    Plan:  Patient was evaluated and treated and all questions answered.  Bilateral numbness tingling to plantar foot without any history of diabetes, lumbar pain/sciatica -I explained to the patient the etiology of numbness tingling and various treatment options were extensively discussed.  I believe patient will benefit from EMG/nerve conduction study to look for any nerve fiber disease/compression.  Clinically I do not appreciate any Tinel's sign to the tarsal tunnel region or the common peroneal nerve near the fibular  head.  I discussed this with the patient extensive detail she states understanding and would like to obtain nerve conduction study   No follow-ups on file.

## 2020-10-19 NOTE — Telephone Encounter (Signed)
[facility]

## 2020-10-19 NOTE — Telephone Encounter (Signed)
Patient called and wanted to know what office she was being referred to for the nerve conduction study

## 2020-10-27 ENCOUNTER — Ambulatory Visit: Payer: Medicare HMO | Admitting: Neurology

## 2020-10-27 ENCOUNTER — Other Ambulatory Visit: Payer: Self-pay

## 2020-10-27 DIAGNOSIS — R202 Paresthesia of skin: Secondary | ICD-10-CM

## 2020-10-27 DIAGNOSIS — R200 Anesthesia of skin: Secondary | ICD-10-CM | POA: Diagnosis not present

## 2020-10-27 DIAGNOSIS — M792 Neuralgia and neuritis, unspecified: Secondary | ICD-10-CM

## 2020-10-27 NOTE — Procedures (Signed)
[facility]  [place]  [place], [place] [address] Tel: [phone] Fax:  [phone] Test Date:  10/27/2020  Patient: Susan Manning DOB: [birth year] Physician: Narda Amber, DO  Sex: Female Height: 5\' 2"  Ref Phys: Boneta Lucks, DPM  ID#: [id number]   Technician:    Patient Complaints: This is a 70 year old female referred for evaluation of bilateral feet pain.  NCV & EMG Findings: Electrodiagnostic testing of the right lower extremity and additional studies of the left shows: Bilateral sural and superficial peroneal sensory responses are within normal limits. Bilateral peroneal and tibial motor responses are within normal limits. Bilateral tibial H reflex studies are within normal limits. There is no evidence of active or chronic motor axonal changes affecting any of the tested muscles.  Motor unit configuration and recruitment pattern is within normal limits.  Impression: This is a normal study of the lower extremities.  In particular, there is no evidence of a sensorimotor polyneuropathy or lumbosacral radiculopathy.   ___________________________ Narda Amber, DO    Nerve Conduction Studies Anti Sensory Summary Table   Stim Site NR Peak (ms) Norm Peak (ms) P-T Amp (V) Norm P-T Amp  Left Sup Peroneal Anti Sensory (Ant Lat Mall)  34C  12 cm    2.2 <4.6 13.7 >3  Right Sup Peroneal Anti Sensory (Ant Lat Mall)  34C  12 cm    2.1 <4.6 14.5 >3  Left Sural Anti Sensory (Lat Mall)  34C  Calf    2.6 <4.6 18.7 >3  Right Sural Anti Sensory (Lat Mall)  34C  Calf    2.8 <4.6 17.8 >3   Motor Summary Table   Stim Site NR Onset (ms) Norm Onset (ms) O-P Amp (mV) Norm O-P Amp Site1 Site2 Delta-0 (ms) Dist (cm) Vel (m/s) Norm Vel (m/s)  Left Peroneal Motor (Ext Dig Brev)  34C  Ankle    3.5 <6.0 3.1 >2.5 B Fib Ankle 6.0 33.0 55 >40  B Fib    9.5  3.1  Poplt B Fib 1.4 7.0 50 >40  Poplt    10.9  2.9         Right Peroneal Motor (Ext Dig Brev)  34C  Ankle     2.7 <6.0 5.1 >2.5 B Fib Ankle 6.2 36.0 58 >40  B Fib    8.9  4.9  Poplt B Fib 1.3 7.0 54 >40  Poplt    10.2  4.8         Left Tibial Motor (Abd Hall Brev)  34C  Ankle    4.8 <6.0 10.3 >4 Knee Ankle 7.4 38.0 51 >40  Knee    12.2  7.3         Right Tibial Motor (Abd Hall Brev)  34C  Ankle    3.0 <6.0 9.1 >4 Knee Ankle 7.6 40.0 53 >40  Knee    10.6  5.5          H Reflex Studies   NR H-Lat (ms) Lat Norm (ms) L-R H-Lat (ms)  Left Tibial (Gastroc)  34C     31.29 <35 0.00  Right Tibial (Gastroc)  34C     31.29 <35 0.00   EMG   Side Muscle Ins Act Fibs Psw Fasc Number Recrt Dur Dur. Amp Amp. Poly Poly. Comment  Right AntTibialis Nml Nml Nml Nml Nml Nml Nml Nml Nml Nml Nml Nml N/A  Right Gastroc Nml Nml Nml Nml Nml Nml Nml Nml Nml Nml Nml Nml N/A  Right Flex Dig Long Nml Nml Nml Nml Nml Nml Nml Nml Nml Nml Nml Nml N/A  Right RectFemoris Nml Nml Nml Nml Nml Nml Nml Nml Nml Nml Nml Nml N/A  Left BicepsFemS Nml Nml Nml Nml Nml Nml Nml Nml Nml Nml Nml Nml N/A  Right BicepsFemS Nml Nml Nml Nml Nml Nml Nml Nml Nml Nml Nml Nml N/A  Left AntTibialis Nml Nml Nml Nml Nml Nml Nml Nml Nml Nml Nml Nml N/A  Left Gastroc Nml Nml Nml Nml Nml Nml Nml Nml Nml Nml Nml Nml N/A  Left Flex Dig Long Nml Nml Nml Nml Nml Nml Nml Nml Nml Nml Nml Nml N/A  Left RectFemoris Nml Nml Nml Nml Nml Nml Nml Nml Nml Nml Nml Nml N/A      Waveforms:

## 2020-11-10 ENCOUNTER — Ambulatory Visit (INDEPENDENT_AMBULATORY_CARE_PROVIDER_SITE_OTHER): Payer: Medicare HMO | Admitting: Podiatry

## 2020-11-10 ENCOUNTER — Other Ambulatory Visit: Payer: Self-pay

## 2020-11-10 DIAGNOSIS — L909 Atrophic disorder of skin, unspecified: Secondary | ICD-10-CM | POA: Diagnosis not present

## 2020-11-10 DIAGNOSIS — R200 Anesthesia of skin: Secondary | ICD-10-CM | POA: Diagnosis not present

## 2020-11-10 DIAGNOSIS — M792 Neuralgia and neuritis, unspecified: Secondary | ICD-10-CM

## 2020-11-10 DIAGNOSIS — R202 Paresthesia of skin: Secondary | ICD-10-CM | POA: Diagnosis not present

## 2020-11-11 DIAGNOSIS — Z0100 Encounter for examination of eyes and vision without abnormal findings: Secondary | ICD-10-CM | POA: Diagnosis not present

## 2020-11-15 ENCOUNTER — Encounter: Payer: Self-pay | Admitting: Podiatry

## 2020-11-15 NOTE — Progress Notes (Signed)
Subjective:  Patient ID: Susan Manning, female    DOB: [birth year],  MRN: [id number]  Chief Complaint  Patient presents with   Numbness    PT stated that she has had no change     70 y.o. female presents with the above complaint.  Patient presents with follow-up from bilateral numbness tingling to both plantar foot.  She states that now it seems to be more in the forefoot as opposed to the entire plantar foot.  She had a nerve conduction study done.  She would like to go over the results.  She states her pain is about the same.  She denies any other acute complaints.   Review of Systems: Negative except as noted in the HPI. Denies N/V/F/Ch.  No past medical history on file.  Current Outpatient Medications:    amLODipine (NORVASC) 5 MG tablet, Take 1 tablet by mouth daily., Disp: , Rfl:    amLODipine (NORVASC) 5 MG tablet, Take 1 tablet by mouth daily., Disp: , Rfl:    erythromycin ophthalmic ointment, SMARTSIG:1 CENTIMETER(S) In Eye(s) Twice Daily, Disp: , Rfl:    fluticasone (FLONASE) 50 MCG/ACT nasal spray, fluticasone propionate 50 mcg/actuation nasal spray,suspension, Disp: , Rfl:    naproxen sodium (ALEVE) 220 MG tablet, 1 tablet with food or milk as needed, Disp: , Rfl:    Omega-3 Fatty Acids (FISH OIL) 1000 MG CAPS, Fish Oil, Disp: , Rfl:    Omega-3 Fatty Acids (FISH OIL) 1200 MG CAPS, 1 capsule with a meal, Disp: , Rfl:    rosuvastatin (CRESTOR) 5 MG tablet, Take 5 mg by mouth daily., Disp: , Rfl:   Social History   Tobacco Use  Smoking Status Not on file  Smokeless Tobacco Not on file    Allergies  Allergen Reactions   Codeine     Other reaction(s): Other (See Comments), pass out   Olmesartan     Other reaction(s): intolerant   Penicillins     Other reaction(s): itching, Other (See Comments)   Ramipril     Other reaction(s): cough   Objective:  There were no vitals filed for this visit. There is no height or weight on file to calculate BMI. Constitutional Well  developed. Well nourished.  Vascular Dorsalis pedis pulses palpable bilaterally. Posterior tibial pulses palpable bilaterally. Capillary refill normal to all digits.  No cyanosis or clubbing noted. Pedal hair growth normal.  Neurologic Normal speech. Oriented to person, place, and time. Protective sensation is intact bilaterally.  Subjective complaint numbness tingling noted to both lower extremity.  Negative Tinel's sign for tarsal tunnel syndrome.  Negative common peroneal nerve nerve compression.  Dermatologic Nails within normal limits Skin within normal limits  Orthopedic: Normal joint ROM without pain or crepitus bilaterally. No visible deformities. No bony tenderness.   Radiographs: None Assessment:   1. Numbness and tingling of foot   2. Neuritis   3. Plantar fat pad atrophy     Plan:  Patient was evaluated and treated and all questions answered.  Bilateral numbness tingling to plantar foot without any history of diabetes, lumbar pain/sciatica -I explained to the patient the etiology of numbness tingling and various treatment options were extensively discussed.  -EMG study/nerve conduction study was reviewed with the patient.  She does not have any nerve compression or any involvement of the nerve at this time.  Plantar fat pad atrophy -I discussed with her that sometimes a loss of plantar fat pad can put a lot of stress and the underlying nerve that  can lead to numbness and tingling in the forefoot.  I encourage shoes with good supportive foundation as well as a good over-the-counter orthotics.  I recommended she obtain power steps.  Also recommended that she will benefit from metatarsal pads. -Metatarsal pads were dispensed   No follow-ups on file.

## 2020-11-19 DIAGNOSIS — M25511 Pain in right shoulder: Secondary | ICD-10-CM | POA: Diagnosis not present

## 2020-11-19 DIAGNOSIS — N760 Acute vaginitis: Secondary | ICD-10-CM | POA: Diagnosis not present

## 2020-12-03 DIAGNOSIS — M25519 Pain in unspecified shoulder: Secondary | ICD-10-CM | POA: Diagnosis not present

## 2020-12-03 DIAGNOSIS — N760 Acute vaginitis: Secondary | ICD-10-CM | POA: Diagnosis not present

## 2020-12-07 ENCOUNTER — Ambulatory Visit: Payer: Medicare HMO | Attending: Internal Medicine

## 2020-12-07 DIAGNOSIS — Z23 Encounter for immunization: Secondary | ICD-10-CM

## 2020-12-07 NOTE — Progress Notes (Signed)
   Covid-19 Vaccination Clinic  Name:  Susan Manning    MRN: [id number] DOB: May 05, 1951  12/07/2020  [name] was observed post Covid-19 immunization for 15 minutes without incident. She was provided with Vaccine Information Sheet and instruction to access the V-Safe system.   [name] was instructed to call 911 with any severe reactions post vaccine: Difficulty breathing  Swelling of face and throat  A fast heartbeat  A bad rash all over body  Dizziness and weakness   Immunizations Administered     Name Date Dose VIS Date Route   PFIZER Comrnaty([place]) Covid-19 Vaccine 12/07/2020  2:38 PM 0.3 mL 04/22/2020 Intramuscular   Manufacturer: [place]   Lot: [id number]   [place]: [id number]

## 2020-12-08 DIAGNOSIS — M25511 Pain in right shoulder: Secondary | ICD-10-CM | POA: Diagnosis not present

## 2021-02-08 DIAGNOSIS — L905 Scar conditions and fibrosis of skin: Secondary | ICD-10-CM | POA: Diagnosis not present

## 2021-02-25 DIAGNOSIS — Z1389 Encounter for screening for other disorder: Secondary | ICD-10-CM | POA: Diagnosis not present

## 2021-02-25 DIAGNOSIS — R7301 Impaired fasting glucose: Secondary | ICD-10-CM | POA: Diagnosis not present

## 2021-02-25 DIAGNOSIS — E7800 Pure hypercholesterolemia, unspecified: Secondary | ICD-10-CM | POA: Diagnosis not present

## 2021-02-25 DIAGNOSIS — Z0000 Encounter for general adult medical examination without abnormal findings: Secondary | ICD-10-CM | POA: Diagnosis not present

## 2021-03-01 DIAGNOSIS — E785 Hyperlipidemia, unspecified: Secondary | ICD-10-CM | POA: Diagnosis not present

## 2021-03-01 DIAGNOSIS — Z0000 Encounter for general adult medical examination without abnormal findings: Secondary | ICD-10-CM | POA: Diagnosis not present

## 2021-03-01 DIAGNOSIS — Z789 Other specified health status: Secondary | ICD-10-CM | POA: Diagnosis not present

## 2021-03-01 DIAGNOSIS — I10 Essential (primary) hypertension: Secondary | ICD-10-CM | POA: Diagnosis not present

## 2021-03-01 DIAGNOSIS — R7303 Prediabetes: Secondary | ICD-10-CM | POA: Diagnosis not present

## 2021-03-07 DIAGNOSIS — H35033 Hypertensive retinopathy, bilateral: Secondary | ICD-10-CM | POA: Diagnosis not present

## 2021-03-07 DIAGNOSIS — H02834 Dermatochalasis of left upper eyelid: Secondary | ICD-10-CM | POA: Diagnosis not present

## 2021-03-07 DIAGNOSIS — H25813 Combined forms of age-related cataract, bilateral: Secondary | ICD-10-CM | POA: Diagnosis not present

## 2021-03-07 DIAGNOSIS — H02831 Dermatochalasis of right upper eyelid: Secondary | ICD-10-CM | POA: Diagnosis not present

## 2021-03-07 DIAGNOSIS — I10 Essential (primary) hypertension: Secondary | ICD-10-CM | POA: Diagnosis not present

## 2021-03-07 DIAGNOSIS — H524 Presbyopia: Secondary | ICD-10-CM | POA: Diagnosis not present

## 2021-04-05 DIAGNOSIS — Z124 Encounter for screening for malignant neoplasm of cervix: Secondary | ICD-10-CM | POA: Diagnosis not present

## 2021-04-05 DIAGNOSIS — Z1231 Encounter for screening mammogram for malignant neoplasm of breast: Secondary | ICD-10-CM | POA: Diagnosis not present

## 2021-04-05 DIAGNOSIS — Z6824 Body mass index (BMI) 24.0-24.9, adult: Secondary | ICD-10-CM | POA: Diagnosis not present

## 2021-04-05 DIAGNOSIS — N952 Postmenopausal atrophic vaginitis: Secondary | ICD-10-CM | POA: Diagnosis not present

## 2021-04-18 DIAGNOSIS — Z23 Encounter for immunization: Secondary | ICD-10-CM | POA: Diagnosis not present

## 2021-04-18 DIAGNOSIS — L03011 Cellulitis of right finger: Secondary | ICD-10-CM | POA: Diagnosis not present

## 2021-04-18 DIAGNOSIS — M19042 Primary osteoarthritis, left hand: Secondary | ICD-10-CM | POA: Diagnosis not present

## 2021-04-18 DIAGNOSIS — R0981 Nasal congestion: Secondary | ICD-10-CM | POA: Diagnosis not present

## 2021-04-18 DIAGNOSIS — M19041 Primary osteoarthritis, right hand: Secondary | ICD-10-CM | POA: Diagnosis not present

## 2021-04-18 DIAGNOSIS — S61216D Laceration without foreign body of right little finger without damage to nail, subsequent encounter: Secondary | ICD-10-CM | POA: Diagnosis not present

## 2021-04-18 DIAGNOSIS — L308 Other specified dermatitis: Secondary | ICD-10-CM | POA: Diagnosis not present

## 2021-05-20 DIAGNOSIS — R3121 Asymptomatic microscopic hematuria: Secondary | ICD-10-CM | POA: Diagnosis not present

## 2021-05-23 ENCOUNTER — Other Ambulatory Visit: Payer: Self-pay

## 2021-05-23 ENCOUNTER — Ambulatory Visit: Payer: Medicare HMO | Attending: Internal Medicine

## 2021-05-23 ENCOUNTER — Other Ambulatory Visit (HOSPITAL_BASED_OUTPATIENT_CLINIC_OR_DEPARTMENT_OTHER): Payer: Self-pay

## 2021-05-23 DIAGNOSIS — Z23 Encounter for immunization: Secondary | ICD-10-CM

## 2021-05-23 MED ORDER — PFIZER COVID-19 VAC BIVALENT 30 MCG/0.3ML IM SUSP
INTRAMUSCULAR | 0 refills | Status: AC
  Filled 2021-05-23: qty 0.3, 1d supply, fill #0

## 2021-05-23 NOTE — Progress Notes (Signed)
° °  Covid-19 Vaccination Clinic  Name:  [name]    MRN: [id number] DOB: [birth year]  05/23/2021  [name] was observed post Covid-19 immunization for 15 minutes without incident. She was provided with Vaccine Information Sheet and instruction to access the V-Safe system.   [name] was instructed to call 911 with any severe reactions post vaccine: Difficulty breathing  Swelling of face and throat  A fast heartbeat  A bad rash all over body  Dizziness and weakness   Immunizations Administered     Name Date Dose VIS Date Route   Pfizer Covid-19 Vaccine Bivalent Booster 05/23/2021  4:04 PM 0.3 mL 01/12/2021 Intramuscular   Manufacturer: [place]   Lot: [id number]   [place]: [phone]

## 2021-08-31 DIAGNOSIS — E785 Hyperlipidemia, unspecified: Secondary | ICD-10-CM | POA: Diagnosis not present

## 2021-08-31 DIAGNOSIS — R7303 Prediabetes: Secondary | ICD-10-CM | POA: Diagnosis not present

## 2021-08-31 DIAGNOSIS — I10 Essential (primary) hypertension: Secondary | ICD-10-CM | POA: Diagnosis not present

## 2021-09-29 DIAGNOSIS — R32 Unspecified urinary incontinence: Secondary | ICD-10-CM | POA: Diagnosis not present

## 2021-09-29 DIAGNOSIS — Z8249 Family history of ischemic heart disease and other diseases of the circulatory system: Secondary | ICD-10-CM | POA: Diagnosis not present

## 2021-09-29 DIAGNOSIS — Z008 Encounter for other general examination: Secondary | ICD-10-CM | POA: Diagnosis not present

## 2021-09-29 DIAGNOSIS — Z791 Long term (current) use of non-steroidal anti-inflammatories (NSAID): Secondary | ICD-10-CM | POA: Diagnosis not present

## 2021-09-29 DIAGNOSIS — M1990 Unspecified osteoarthritis, unspecified site: Secondary | ICD-10-CM | POA: Diagnosis not present

## 2021-09-29 DIAGNOSIS — Z833 Family history of diabetes mellitus: Secondary | ICD-10-CM | POA: Diagnosis not present

## 2021-09-29 DIAGNOSIS — Z823 Family history of stroke: Secondary | ICD-10-CM | POA: Diagnosis not present

## 2021-09-29 DIAGNOSIS — I10 Essential (primary) hypertension: Secondary | ICD-10-CM | POA: Diagnosis not present

## 2021-09-29 DIAGNOSIS — E785 Hyperlipidemia, unspecified: Secondary | ICD-10-CM | POA: Diagnosis not present

## 2021-11-21 DIAGNOSIS — M65312 Trigger thumb, left thumb: Secondary | ICD-10-CM | POA: Diagnosis not present

## 2021-11-25 ENCOUNTER — Other Ambulatory Visit (HOSPITAL_COMMUNITY): Payer: Self-pay

## 2021-12-07 DIAGNOSIS — M65312 Trigger thumb, left thumb: Secondary | ICD-10-CM | POA: Diagnosis not present

## 2021-12-07 DIAGNOSIS — M79642 Pain in left hand: Secondary | ICD-10-CM | POA: Diagnosis not present

## 2022-02-27 DIAGNOSIS — Z1389 Encounter for screening for other disorder: Secondary | ICD-10-CM | POA: Diagnosis not present

## 2022-02-27 DIAGNOSIS — Z6825 Body mass index (BMI) 25.0-25.9, adult: Secondary | ICD-10-CM | POA: Diagnosis not present

## 2022-02-27 DIAGNOSIS — Z0000 Encounter for general adult medical examination without abnormal findings: Secondary | ICD-10-CM | POA: Diagnosis not present

## 2022-03-01 DIAGNOSIS — L910 Hypertrophic scar: Secondary | ICD-10-CM | POA: Diagnosis not present

## 2022-03-01 DIAGNOSIS — R7303 Prediabetes: Secondary | ICD-10-CM | POA: Diagnosis not present

## 2022-03-01 DIAGNOSIS — E785 Hyperlipidemia, unspecified: Secondary | ICD-10-CM | POA: Diagnosis not present

## 2022-03-01 DIAGNOSIS — G629 Polyneuropathy, unspecified: Secondary | ICD-10-CM | POA: Diagnosis not present

## 2022-03-01 DIAGNOSIS — I10 Essential (primary) hypertension: Secondary | ICD-10-CM | POA: Diagnosis not present

## 2022-03-06 ENCOUNTER — Other Ambulatory Visit (HOSPITAL_BASED_OUTPATIENT_CLINIC_OR_DEPARTMENT_OTHER): Payer: Self-pay

## 2022-03-06 DIAGNOSIS — H524 Presbyopia: Secondary | ICD-10-CM | POA: Diagnosis not present

## 2022-03-06 DIAGNOSIS — H16223 Keratoconjunctivitis sicca, not specified as Sjogren's, bilateral: Secondary | ICD-10-CM | POA: Diagnosis not present

## 2022-03-06 DIAGNOSIS — H25813 Combined forms of age-related cataract, bilateral: Secondary | ICD-10-CM | POA: Diagnosis not present

## 2022-03-06 MED ORDER — COMIRNATY 30 MCG/0.3ML IM SUSY
PREFILLED_SYRINGE | INTRAMUSCULAR | 0 refills | Status: AC
  Filled 2022-03-06: qty 0.3, 1d supply, fill #0

## 2022-03-21 DIAGNOSIS — H25813 Combined forms of age-related cataract, bilateral: Secondary | ICD-10-CM | POA: Diagnosis not present

## 2022-03-24 DIAGNOSIS — L308 Other specified dermatitis: Secondary | ICD-10-CM | POA: Diagnosis not present

## 2022-03-24 DIAGNOSIS — L299 Pruritus, unspecified: Secondary | ICD-10-CM | POA: Diagnosis not present

## 2022-04-08 DIAGNOSIS — Z0100 Encounter for examination of eyes and vision without abnormal findings: Secondary | ICD-10-CM | POA: Diagnosis not present

## 2022-04-11 DIAGNOSIS — Z6825 Body mass index (BMI) 25.0-25.9, adult: Secondary | ICD-10-CM | POA: Diagnosis not present

## 2022-04-11 DIAGNOSIS — Z1231 Encounter for screening mammogram for malignant neoplasm of breast: Secondary | ICD-10-CM | POA: Diagnosis not present

## 2022-04-11 DIAGNOSIS — Z01419 Encounter for gynecological examination (general) (routine) without abnormal findings: Secondary | ICD-10-CM | POA: Diagnosis not present

## 2022-04-18 DIAGNOSIS — L905 Scar conditions and fibrosis of skin: Secondary | ICD-10-CM | POA: Diagnosis not present

## 2022-04-18 DIAGNOSIS — L308 Other specified dermatitis: Secondary | ICD-10-CM | POA: Diagnosis not present

## 2022-06-05 DIAGNOSIS — Z6825 Body mass index (BMI) 25.0-25.9, adult: Secondary | ICD-10-CM | POA: Diagnosis not present

## 2022-06-05 DIAGNOSIS — M25521 Pain in right elbow: Secondary | ICD-10-CM | POA: Diagnosis not present

## 2022-07-19 DIAGNOSIS — M65312 Trigger thumb, left thumb: Secondary | ICD-10-CM | POA: Diagnosis not present

## 2022-08-17 DIAGNOSIS — R69 Illness, unspecified: Secondary | ICD-10-CM | POA: Diagnosis not present

## 2022-09-01 DIAGNOSIS — R1312 Dysphagia, oropharyngeal phase: Secondary | ICD-10-CM | POA: Diagnosis not present

## 2022-09-01 DIAGNOSIS — I10 Essential (primary) hypertension: Secondary | ICD-10-CM | POA: Diagnosis not present

## 2022-09-01 DIAGNOSIS — R7303 Prediabetes: Secondary | ICD-10-CM | POA: Diagnosis not present

## 2022-09-01 DIAGNOSIS — R09A2 Foreign body sensation, throat: Secondary | ICD-10-CM | POA: Diagnosis not present

## 2022-09-01 DIAGNOSIS — E785 Hyperlipidemia, unspecified: Secondary | ICD-10-CM | POA: Diagnosis not present

## 2022-09-01 DIAGNOSIS — E041 Nontoxic single thyroid nodule: Secondary | ICD-10-CM | POA: Diagnosis not present

## 2022-09-04 ENCOUNTER — Other Ambulatory Visit: Payer: Self-pay | Admitting: Family Medicine

## 2022-09-04 DIAGNOSIS — E041 Nontoxic single thyroid nodule: Secondary | ICD-10-CM

## 2022-09-05 DIAGNOSIS — E785 Hyperlipidemia, unspecified: Secondary | ICD-10-CM | POA: Diagnosis not present

## 2022-09-05 DIAGNOSIS — Z823 Family history of stroke: Secondary | ICD-10-CM | POA: Diagnosis not present

## 2022-09-05 DIAGNOSIS — I10 Essential (primary) hypertension: Secondary | ICD-10-CM | POA: Diagnosis not present

## 2022-09-05 DIAGNOSIS — R69 Illness, unspecified: Secondary | ICD-10-CM | POA: Diagnosis not present

## 2022-09-05 DIAGNOSIS — Z8249 Family history of ischemic heart disease and other diseases of the circulatory system: Secondary | ICD-10-CM | POA: Diagnosis not present

## 2022-09-05 DIAGNOSIS — Z833 Family history of diabetes mellitus: Secondary | ICD-10-CM | POA: Diagnosis not present

## 2022-09-18 ENCOUNTER — Ambulatory Visit
Admission: RE | Admit: 2022-09-18 | Discharge: 2022-09-18 | Disposition: A | Discharge status: Home / Self Care | Payer: Medicare HMO | Source: Ambulatory Visit | Attending: Family Medicine | Admitting: Family Medicine

## 2022-09-18 DIAGNOSIS — E041 Nontoxic single thyroid nodule: Secondary | ICD-10-CM

## 2022-09-18 DIAGNOSIS — R911 Solitary pulmonary nodule: Secondary | ICD-10-CM | POA: Diagnosis not present

## 2022-09-21 ENCOUNTER — Other Ambulatory Visit: Payer: Self-pay | Admitting: Family Medicine

## 2022-09-21 DIAGNOSIS — E041 Nontoxic single thyroid nodule: Secondary | ICD-10-CM

## 2022-09-27 DIAGNOSIS — Z6825 Body mass index (BMI) 25.0-25.9, adult: Secondary | ICD-10-CM | POA: Diagnosis not present

## 2022-09-27 DIAGNOSIS — L299 Pruritus, unspecified: Secondary | ICD-10-CM | POA: Diagnosis not present

## 2022-09-28 DIAGNOSIS — R69 Illness, unspecified: Secondary | ICD-10-CM | POA: Diagnosis not present

## 2022-10-23 ENCOUNTER — Other Ambulatory Visit (HOSPITAL_COMMUNITY)
Admission: RE | Admit: 2022-10-23 | Discharge: 2022-10-23 | Disposition: A | Discharge status: Home / Self Care | Payer: Medicare HMO | Source: Ambulatory Visit | Attending: Interventional Radiology | Admitting: Interventional Radiology

## 2022-10-23 ENCOUNTER — Ambulatory Visit
Admission: RE | Admit: 2022-10-23 | Discharge: 2022-10-23 | Disposition: A | Discharge status: Home / Self Care | Payer: Medicare HMO | Source: Ambulatory Visit | Attending: Family Medicine | Admitting: Family Medicine

## 2022-10-23 DIAGNOSIS — E041 Nontoxic single thyroid nodule: Secondary | ICD-10-CM | POA: Diagnosis not present

## 2022-10-24 LAB — CYTOLOGY - NON PAP

## 2022-10-27 DIAGNOSIS — L905 Scar conditions and fibrosis of skin: Secondary | ICD-10-CM | POA: Diagnosis not present

## 2022-10-27 DIAGNOSIS — L218 Other seborrheic dermatitis: Secondary | ICD-10-CM | POA: Diagnosis not present

## 2022-11-24 ENCOUNTER — Other Ambulatory Visit: Payer: Self-pay | Admitting: Family Medicine

## 2022-11-24 DIAGNOSIS — M8580 Other specified disorders of bone density and structure, unspecified site: Secondary | ICD-10-CM
